# Patient Record
Sex: Female | Born: 2000 | Race: Black or African American | Hispanic: No | Marital: Single | Smoking: Never smoker
Health system: Southern US, Community
[De-identification: ages and names within clinical notes are randomized; demographics above are authoritative.]

## PROBLEM LIST (undated history)

## (undated) DIAGNOSIS — F419 Anxiety disorder, unspecified: Secondary | ICD-10-CM

---
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
## 2019-04-27 ENCOUNTER — Encounter: Payer: Self-pay | Admitting: Emergency Medicine

## 2019-04-27 ENCOUNTER — Other Ambulatory Visit: Payer: Self-pay

## 2019-04-27 ENCOUNTER — Ambulatory Visit
Admission: EM | Admit: 2019-04-27 | Discharge: 2019-04-27 | Disposition: A | Discharge status: Home / Self Care | Payer: Medicaid Other | Attending: Emergency Medicine | Admitting: Emergency Medicine

## 2019-04-27 DIAGNOSIS — S161XXA Strain of muscle, fascia and tendon at neck level, initial encounter: Secondary | ICD-10-CM

## 2019-04-27 MED ORDER — MELOXICAM 15 MG PO TABS: 15.0000 mg | ORAL_TABLET | Freq: Every day | ORAL | 0 refills | Status: AC

## 2019-04-27 MED ORDER — METAXALONE 800 MG PO TABS: 800.0000 mg | ORAL_TABLET | Freq: Three times a day (TID) | ORAL | 0 refills | Status: AC

## 2019-04-27 NOTE — Discharge Instructions (Signed)
Avoid symptoms as much as possible.Use  Caution while taking muscle relaxers.  Do not perform activities requiring concentration or judgment and do not drive.  Apply ice for 20 minutes 3 to 4 times daily as necessary for discomfort.  Consider using Biofreeze or similar gel for muscle pain 3 times daily.  If you are not improving recommend following up with your primary care physician
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
## 2020-10-15 ENCOUNTER — Ambulatory Visit: Payer: Medicaid Other

## 2020-11-06 ENCOUNTER — Ambulatory Visit: Payer: Medicaid Other

## 2020-11-29 ENCOUNTER — Emergency Department: Payer: 59

## 2020-11-29 ENCOUNTER — Emergency Department
Admission: EM | Admit: 2020-11-29 | Discharge: 2020-11-29 | Disposition: A | Discharge status: Home / Self Care | Payer: 59 | Attending: Emergency Medicine | Admitting: Emergency Medicine

## 2020-11-29 ENCOUNTER — Other Ambulatory Visit: Payer: Self-pay

## 2020-11-29 DIAGNOSIS — R1031 Right lower quadrant pain: Secondary | ICD-10-CM | POA: Insufficient documentation

## 2020-11-29 DIAGNOSIS — N16 Renal tubulo-interstitial disorders in diseases classified elsewhere: Secondary | ICD-10-CM | POA: Insufficient documentation

## 2020-11-29 DIAGNOSIS — N12 Tubulo-interstitial nephritis, not specified as acute or chronic: Secondary | ICD-10-CM

## 2020-11-29 LAB — URINALYSIS, COMPLETE (UACMP) WITH MICROSCOPIC
Bilirubin Urine: NEGATIVE
Glucose, UA: NEGATIVE mg/dL
Ketones, ur: NEGATIVE mg/dL
Nitrite: NEGATIVE
RBC / HPF: >50 RBC/hpf (ref 0–5)
Specific Gravity, Urine: 1.02 (ref 1.005–1.030)
WBC, UA: >50 WBC/hpf (ref 0–5)
pH: 7 (ref 5.0–8.0)

## 2020-11-29 LAB — POC URINE PREG, ED: Preg Test, Ur: NEGATIVE

## 2020-11-29 LAB — LIPASE, BLOOD: Lipase: 26 U/L (ref 11–51)

## 2020-11-29 LAB — CBC
HCT: 38.2 % (ref 36.0–46.0)
Hemoglobin: 12.1 g/dL (ref 12.0–15.0)
MCH: 24.5 pg — ABNORMAL LOW (ref 26.0–34.0)
MCHC: 31.7 g/dL (ref 30.0–36.0)
MCV: 77.3 fL — ABNORMAL LOW (ref 80.0–100.0)
Platelets: 327 10*3/uL (ref 150–400)
RBC: 4.94 MIL/uL (ref 3.87–5.11)
RDW: 17.2 % — ABNORMAL HIGH (ref 11.5–15.5)
WBC: 9.3 10*3/uL (ref 4.0–10.5)
nRBC: 0 % (ref 0.0–0.2)

## 2020-11-29 LAB — COMPREHENSIVE METABOLIC PANEL
ALT: 15 U/L (ref 0–44)
AST: 25 U/L (ref 15–41)
Albumin: 4.1 g/dL (ref 3.5–5.0)
Alkaline Phosphatase: 75 U/L (ref 38–126)
Anion gap: 6 (ref 5–15)
BUN: 12 mg/dL (ref 6–20)
CO2: 27 mmol/L (ref 22–32)
Calcium: 9.2 mg/dL (ref 8.9–10.3)
Chloride: 104 mmol/L (ref 98–111)
Creatinine, Ser: 0.76 mg/dL (ref 0.44–1.00)
GFR, Estimated: >60 mL/min (ref >60)
Glucose, Bld: 93 mg/dL (ref 70–99)
Potassium: 3.9 mmol/L (ref 3.5–5.1)
Sodium: 137 mmol/L (ref 135–145)
Total Bilirubin: 0.7 mg/dL (ref 0.3–1.2)
Total Protein: 7.6 g/dL (ref 6.5–8.1)

## 2020-11-29 MED ORDER — CEPHALEXIN 500 MG PO CAPS: 500.0000 mg | ORAL_CAPSULE | Freq: Four times a day (QID) | ORAL | 0 refills | Status: AC

## 2020-11-29 MED ORDER — CEPHALEXIN 500 MG PO CAPS
500.0000 mg | ORAL_CAPSULE | Freq: Once | ORAL | Status: AC
  Administered 2020-11-29: 500 mg via ORAL
  Filled 2020-11-29: qty 1

## 2020-11-29 MED ORDER — KETOROLAC TROMETHAMINE 30 MG/ML IJ SOLN
15.0000 mg | Freq: Once | INTRAMUSCULAR | Status: AC
  Administered 2020-11-29: 15 mg via INTRAVENOUS
  Filled 2020-11-29: qty 1

## 2020-11-29 NOTE — ED Provider Notes (Signed)
[facility]  ____________________________________________   Event Date/Time   First MD Initiated Contact with Patient 11/29/20 1240     (approximate)  I have reviewed the triage vital signs and the nursing notes.   HISTORY  Chief Complaint Abdominal Pain    HPI Felicia Duncan is a 20 y.o. female with no significant past medical history who presents with right-sided flank and abdominal pain.  Symptoms started several days ago.  She endorses pain in the right side of her back that radiates around to the right lower abdomen.  Has no nausea or vomiting.  Denies any fevers or chills.  Has had some constipation but no diarrhea.  She has also noticed blood in her urine and dysuria and urgency.  Has had prior UTIs that this feels worse than her normal UTI.  No prior history of kidney stones.  She is tolerating p.o.         History reviewed. No pertinent past medical history.  There are no problems to display for this patient.   History reviewed. No pertinent surgical history.  Prior to Admission medications   Medication Sig Start Date End Date Taking? Authorizing Provider  cephALEXin (KEFLEX) 500 MG capsule Take 1 capsule (500 mg total) by mouth 4 (four) times daily for 10 days. 11/29/20 12/09/20 Yes Georga Hacking, MD  cetirizine (ZYRTEC) 10 MG chewable tablet Chew 1 tablet (10 mg total) by mouth daily. 11/29/18   Wurst, [place], PA-C  fluticasone (FLONASE) 50 MCG/ACT nasal spray Place 2 sprays into both nostrils daily. 11/29/18   Wurst, [place], PA-C  meloxicam (MOBIC) 15 MG tablet Take 1 tablet (15 mg total) by mouth daily. 04/27/19   Lutricia Feil, PA-C  metaxalone (SKELAXIN) 800 MG tablet Take 1 tablet (800 mg total) by mouth 3 (three) times daily. 04/27/19   Lutricia Feil, PA-C  TRI-LO-MARZIA 0.18/0.215/0.25 MG-25 MCG tab Take 1 tablet by mouth daily. 10/17/17   [provider]    Allergies Patient has no known allergies.  Family  History  Problem Relation Age of Onset   Hypertension Maternal Grandmother    Lupus Paternal Grandmother    Hypertension Mother    Healthy Father     Social History Social History   Tobacco Use   Smoking status: Never   Smokeless tobacco: Never  Vaping Use   Vaping Use: Never used  Substance Use Topics   Alcohol use: Never   Drug use: Never    Review of Systems   Review of Systems  Constitutional:  Negative for chills and fever.  Respiratory:  Negative for shortness of breath.   Gastrointestinal:  Positive for abdominal pain and constipation. Negative for diarrhea, nausea and vomiting.  Genitourinary:  Positive for dyspareunia, flank pain and hematuria. Negative for menstrual problem, vaginal discharge and vaginal pain.  All other systems reviewed and are negative.  Physical Exam Updated Vital Signs BP 120/76   Pulse 74   Temp 98 F (36.7 C)   Resp 16   LMP 11/12/2020   SpO2 100%   Physical Exam Vitals and nursing note reviewed.  Constitutional:      General: She is not in acute distress.    Appearance: Normal appearance.  HENT:     Head: Normocephalic and atraumatic.  Eyes:     General: No scleral icterus.    Conjunctiva/sclera: Conjunctivae normal.  Pulmonary:     Effort: Pulmonary effort is normal. No respiratory distress.     Breath sounds:  No stridor.  Musculoskeletal:        General: No deformity or signs of injury.     Cervical back: Normal range of motion.  Skin:    General: Skin is dry.     Coloration: Skin is not jaundiced or pale.  Neurological:     General: No focal deficit present.     Mental Status: She is alert and oriented to person, place, and time. Mental status is at baseline.  Psychiatric:        Mood and Affect: Mood normal.        Behavior: Behavior normal.     LABS (all labs ordered are listed, but only abnormal results are displayed)  Labs Reviewed  CBC - Abnormal; Notable for the following components:      Result Value    MCV 77.3 (*)    MCH 24.5 (*)    RDW 17.2 (*)    All other components within normal limits  URINALYSIS, COMPLETE (UACMP) WITH MICROSCOPIC - Abnormal; Notable for the following components:   APPearance HAZY (*)    Hgb urine dipstick MODERATE (*)    Protein, ur TRACE (*)    Leukocytes,Ua MODERATE (*)    Non Squamous Epithelial PRESENT (*)    Bacteria, UA RARE (*)    All other components within normal limits  URINE CULTURE  LIPASE, BLOOD  COMPREHENSIVE METABOLIC PANEL  POC URINE PREG, ED   ____________________________________________  EKG  N/a ____________________________________________  RADIOLOGY Ky Barban, personally viewed and evaluated these images (plain radiographs) as part of my medical decision making, as well as reviewing the written report by the radiologist.  ED MD interpretation: I reviewed the CT renal study which does not show any hydronephrosis or stone    ____________________________________________   PROCEDURES  Procedure(s) performed (including Critical Care):  Procedures   ____________________________________________   INITIAL IMPRESSION / ASSESSMENT AND PLAN / ED COURSE     20 year old female presenting with several days of right-sided flank and abdominal pain and urinary symptoms.  Vital signs within normal limits.  She is very well-appearing.  She has mild right-sided CVA tenderness as well as some mild tenderness in the mid right lower quadrant without guarding.  I am suspicious for kidney stone.  Will check UA and blood work.  Likely will need renal study.  Patient's labs are reassuring.  Her UA shows leuks and RBCs.  CT renal study obtained which does not show any stone.  She does have a small ovarian cyst.  My suspicion for torsion is exceedingly low, cyst is less than 5 cm and she appears very comfortable at this time.  Will treat for pyelonephritis given her UTI and flank pain.  Will write for 10-day course of Keflex.  We  discussed return precautions.  Clinical Course as of 11/29/20 1535  Sun Nov 29, 2020  1320 Preg Test, Ur: NEGATIVE [KM]    Clinical Course User Index [KM] Georga Hacking, MD     ____________________________________________   FINAL CLINICAL IMPRESSION(S) / ED DIAGNOSES  Final diagnoses:  Pyelonephritis     ED Discharge Orders          Ordered    cephALEXin (KEFLEX) 500 MG capsule  4 times daily        11/29/20 1530             Note:  This document was prepared using Dragon voice recognition software and may include unintentional dictation errors.    Augusto Gamble  Okey Dupre, MD 11/29/20 1535

## 2020-11-29 NOTE — ED Triage Notes (Signed)
Pt come with c/o right lower abdominal pain for few days. Pt denies any N.V.d  Pt states some constipation

## 2020-11-29 NOTE — Discharge Instructions (Signed)
Please take the antibiotic twice daily for the next 10 days. 

## 2020-12-02 LAB — URINE CULTURE: Culture: 40000 — AB

## 2021-08-20 ENCOUNTER — Encounter: Payer: Self-pay | Admitting: Emergency Medicine

## 2021-08-20 ENCOUNTER — Other Ambulatory Visit: Payer: Self-pay

## 2021-08-20 ENCOUNTER — Emergency Department: Payer: 59

## 2021-08-20 ENCOUNTER — Emergency Department
Admission: EM | Admit: 2021-08-20 | Discharge: 2021-08-20 | Disposition: A | Discharge status: Home / Self Care | Payer: 59 | Attending: Emergency Medicine | Admitting: Emergency Medicine

## 2021-08-20 DIAGNOSIS — D72829 Elevated white blood cell count, unspecified: Secondary | ICD-10-CM | POA: Insufficient documentation

## 2021-08-20 DIAGNOSIS — Z3A01 Less than 8 weeks gestation of pregnancy: Secondary | ICD-10-CM | POA: Diagnosis not present

## 2021-08-20 DIAGNOSIS — O4690 Antepartum hemorrhage, unspecified, unspecified trimester: Secondary | ICD-10-CM

## 2021-08-20 DIAGNOSIS — O209 Hemorrhage in early pregnancy, unspecified: Secondary | ICD-10-CM | POA: Insufficient documentation

## 2021-08-20 HISTORY — DX: Anxiety disorder, unspecified: F41.9

## 2021-08-20 LAB — COMPREHENSIVE METABOLIC PANEL
ALT: 14 U/L (ref 0–44)
AST: 21 U/L (ref 15–41)
Albumin: 3.7 g/dL (ref 3.5–5.0)
Alkaline Phosphatase: 56 U/L (ref 38–126)
Anion gap: 7 (ref 5–15)
BUN: 10 mg/dL (ref 6–20)
CO2: 24 mmol/L (ref 22–32)
Calcium: 9.3 mg/dL (ref 8.9–10.3)
Chloride: 107 mmol/L (ref 98–111)
Creatinine, Ser: 0.59 mg/dL (ref 0.44–1.00)
GFR, Estimated: >60 mL/min (ref >60)
Glucose, Bld: 103 mg/dL — ABNORMAL HIGH (ref 70–99)
Potassium: 3.5 mmol/L (ref 3.5–5.1)
Sodium: 138 mmol/L (ref 135–145)
Total Bilirubin: 0.4 mg/dL (ref 0.3–1.2)
Total Protein: 7.4 g/dL (ref 6.5–8.1)

## 2021-08-20 LAB — CBC WITH DIFFERENTIAL/PLATELET
Abs Immature Granulocytes: 0.04 10*3/uL (ref 0.00–0.07)
Basophils Absolute: 0 10*3/uL (ref 0.0–0.1)
Basophils Relative: 0 %
Eosinophils Absolute: 0 10*3/uL (ref 0.0–0.5)
Eosinophils Relative: 0 %
HCT: 37.9 % (ref 36.0–46.0)
Hemoglobin: 11.8 g/dL — ABNORMAL LOW (ref 12.0–15.0)
Immature Granulocytes: 1 %
Lymphocytes Relative: 19 %
Lymphs Abs: 1.6 10*3/uL (ref 0.7–4.0)
MCH: 25.2 pg — ABNORMAL LOW (ref 26.0–34.0)
MCHC: 31.1 g/dL (ref 30.0–36.0)
MCV: 81 fL (ref 80.0–100.0)
Monocytes Absolute: 0.5 10*3/uL (ref 0.1–1.0)
Monocytes Relative: 6 %
Neutro Abs: 6.1 10*3/uL (ref 1.7–7.7)
Neutrophils Relative %: 74 %
Platelets: 269 10*3/uL (ref 150–400)
RBC: 4.68 MIL/uL (ref 3.87–5.11)
RDW: 15.7 % — ABNORMAL HIGH (ref 11.5–15.5)
WBC: 8.3 10*3/uL (ref 4.0–10.5)
nRBC: 0 % (ref 0.0–0.2)

## 2021-08-20 LAB — URINALYSIS, ROUTINE W REFLEX MICROSCOPIC
Bilirubin Urine: NEGATIVE
Glucose, UA: NEGATIVE mg/dL
Hgb urine dipstick: NEGATIVE
Ketones, ur: NEGATIVE mg/dL
Nitrite: NEGATIVE
Protein, ur: NEGATIVE mg/dL
Specific Gravity, Urine: 1.012 (ref 1.005–1.030)
pH: 6 (ref 5.0–8.0)

## 2021-08-20 LAB — HCG, QUANTITATIVE, PREGNANCY: hCG, Beta Chain, Quant, S: 73815 m[IU]/mL — ABNORMAL HIGH (ref <5)

## 2021-08-20 LAB — ABO/RH: ABO/RH(D): A POS

## 2021-08-20 LAB — POC URINE PREG, ED: Preg Test, Ur: POSITIVE — AB

## 2021-08-20 MED ORDER — CEPHALEXIN 500 MG PO CAPS: 500.0000 mg | ORAL_CAPSULE | Freq: Three times a day (TID) | ORAL | 0 refills | Status: AC

## 2021-08-20 NOTE — Discharge Instructions (Addendum)
-  Follow-up with the OB/GYN provider as discussed.  -Take all antibiotics as prescribed  -Return to the emergency department anytime if you begin to experience any new or worsening symptoms.

## 2021-08-20 NOTE — ED Notes (Addendum)
First Nurse Note:  Pt to ED via POV stating that she is 4-[redacted] weeks pregnant. Pt states that she is having some mild spotting yesterday afternoon. Pt states that there was only a little blood on the tissue when she wiped, when she wiped a second time there was no blood. Her PCP and OBGYN told her to come to the ED.   LMP 07/04/2021

## 2021-08-20 NOTE — ED Provider Notes (Addendum)
[facility] Provider Note    Event Date/Time   First MD Initiated Contact with Patient 08/20/21 1506     (approximate)   History   Chief Complaint Vaginal Bleeding   HPI Felicia Duncan is a 21 y.o. female, history of anxiety, presents to the emergency department for evaluation of vaginal bleeding.  She states that she is estimated approximately 4 to [redacted] weeks pregnant with her first pregnancy she has not had her first appointment with OB/GYN yet.  She states that over the past 2 days, she has had a few episodes of light vaginal bleeding that she has predominantly noticed when she wipes after urinating.  She has had some lower abdominal cramping as well, though reportedly nothing severe.  Endorses some mild nausea as well.  She contacted her primary care provider, who referred her to the emergency department.  Denies any recent trauma, illnesses, or injuries.  Denies fever/chills, dysuria, chest pain, shortness of breath, abdominal pain, flank pain, rash/lesions, or dizziness/lightheadedness.   History Limitations: No limitations        Physical Exam  Triage Vital Signs: ED Triage Vitals  Enc Vitals Group     BP 08/20/21 1329 120/63     Pulse Rate 08/20/21 1329 (!) 102     Resp 08/20/21 1329 16     Temp 08/20/21 1329 98.1 F (36.7 C)     Temp Source 08/20/21 1329 Oral     SpO2 08/20/21 1329 98 %     Weight 08/20/21 1325 193 lb (87.5 kg)     Height 08/20/21 1325 5\' 2"  (1.575 m)     Head Circumference --      Peak Flow --      Pain Score 08/20/21 1325 0     Pain Loc --      Pain Edu? --      Excl. in [place]? --     Most recent vital signs: Vitals:   08/20/21 1329  BP: 120/63  Pulse: (!) 102  Resp: 16  Temp: 98.1 F (36.7 C)  SpO2: 98%    General: Awake, NAD.  Skin: Warm, dry. No rashes or lesions.  Eyes: PERRL. Conjunctivae normal.  CV: Good peripheral perfusion.  Resp: Normal effort.  Abd: Soft, non-tender. No distention.  Neuro: At  baseline. No gross neurological deficits.   Focused Exam: N/A  Physical Exam    ED Results / Procedures / Treatments  Labs (all labs ordered are listed, but only abnormal results are displayed) Labs Reviewed  COMPREHENSIVE METABOLIC PANEL - Abnormal; Notable for the following components:      Result Value   Glucose, Bld 103 (*)    All other components within normal limits  CBC WITH DIFFERENTIAL/PLATELET - Abnormal; Notable for the following components:   Hemoglobin 11.8 (*)    MCH 25.2 (*)    RDW 15.7 (*)    All other components within normal limits  HCG, QUANTITATIVE, PREGNANCY - Abnormal; Notable for the following components:   hCG, Beta Chain, Quant, S [id number] (*)    All other components within normal limits  URINALYSIS, ROUTINE W REFLEX MICROSCOPIC - Abnormal; Notable for the following components:   Color, Urine YELLOW (*)    APPearance HAZY (*)    Leukocytes,Ua TRACE (*)    Bacteria, UA RARE (*)    All other components within normal limits  POC URINE PREG, ED - Abnormal; Notable for the following components:   Preg Test, Ur Positive (*)  All other components within normal limits  ABO/RH     EKG N/A.   RADIOLOGY  ED Provider Interpretation: I personally viewed and interpreted this ultrasound.  Single live uterine pregnancy with fetal heart tones present.  US OB LESS THAN 14 WEEKS WITH OB TRANSVAGINAL  Result Date: 08/20/2021 CLINICAL DATA:  Vaginal bleeding.  Positive pregnancy test EXAM: OBSTETRIC <14 WK [place] AND TRANSVAGINAL OB US TECHNIQUE: Both transabdominal and transvaginal ultrasound examinations were performed for complete evaluation of the gestation as well as the maternal uterus, adnexal regions, and pelvic cul-de-sac. Transvaginal technique was performed to assess early pregnancy. COMPARISON:  None Available. FINDINGS: Intrauterine gestational sac: Single Yolk sac:  Visualized. Embryo:  Visualized. Cardiac Activity: Visualized. Heart Rate: 129 bpm CRL:  5.2  mm   6 w   1 d                  [place] EDC: 04/11/2022 Subchorionic hemorrhage:  None visualized. Maternal uterus/adnexae: Uterus and bilateral ovaries within normal limits. IMPRESSION: 1. Single live intrauterine gestation measuring 6 weeks 1 day by crown-rump length. 2. Active heart tones at 129 BPM. Electronically Signed   By: Duanne Guess D.O.   On: 08/20/2021 17:13    PROCEDURES:  Critical Care performed: N/A.  Procedures    MEDICATIONS ORDERED IN ED: Medications - No data to display   IMPRESSION / MDM / ASSESSMENT AND PLAN / ED COURSE  I reviewed the triage vital signs and the nursing notes.                              Differential diagnosis includes, but is not limited to, spontaneous abortion, implantation bleeding, cystitis, cervical polyps, fibroids, ectopic pregnancy, subchorionic hemorrhage.  ED Course Patient appears well, vitals within normal limits.  NAD  CBC shows no leukocytosis or clinically significant anemia  CMP shows no evidence of transaminitis, AKI, or electrolyte abnormalities.  Beta-hCG [id number], consistent with ultrasound findings.  Urinalysis shows trace leukocytes and rare bacteria.  She is currently asymptomatic at this time.  Assessment/Plan Patient presents with 2 episodes of mild vaginal bleeding while wiping.  She clinically appears well.  Lab work-up has been reassuring, though does show some trace leukocytes and bacteria on urinalysis we will provide her with a short course of cephalexin.  Ultrasound reassuring for single live intrauterine gestation measuring 6 weeks and 1 day, consistent with her beta-hCG.  No evidence of any serious pathology at this point.  Advised her to follow-up with her OB/GYN in a couple weeks, as originally scheduled.  We will plan to discharge.  Patient's presentation is most consistent with acute complicated illness / injury requiring diagnostic workup.   Considered admission for this patient, but given her stable  presentation and reassuring work-up, she is unlikely to benefit from admission  Provided the patient with anticipatory guidance, return precautions, and educational material. Encouraged the patient to return to the emergency department at any time if they begin to experience any new or worsening symptoms. Patient expressed understanding and agreed with the plan.       FINAL CLINICAL IMPRESSION(S) / ED DIAGNOSES   Final diagnoses:  Vaginal bleeding in pregnancy     Rx / DC Orders   ED Discharge Orders          Ordered    cephALEXin (KEFLEX) 500 MG capsule  3 times daily        08/20/21 1728  Note:  This document was prepared using Dragon voice recognition software and may include unintentional dictation errors.   Varney Daily, PA 08/20/21 1732    Varney Daily, [place] 08/20/21 1733    Delton Prairie, MD 08/20/21 [phone]

## 2022-01-22 ENCOUNTER — Emergency Department
Admission: EM | Admit: 2022-01-22 | Discharge: 2022-01-22 | Disposition: A | Discharge status: Home / Self Care | Payer: 59 | Attending: Emergency Medicine | Admitting: Emergency Medicine

## 2022-01-22 ENCOUNTER — Other Ambulatory Visit: Payer: Self-pay

## 2022-01-22 DIAGNOSIS — Z3A29 29 weeks gestation of pregnancy: Secondary | ICD-10-CM | POA: Insufficient documentation

## 2022-01-22 DIAGNOSIS — F410 Panic disorder [episodic paroxysmal anxiety] without agoraphobia: Secondary | ICD-10-CM | POA: Insufficient documentation

## 2022-01-22 DIAGNOSIS — O26893 Other specified pregnancy related conditions, third trimester: Secondary | ICD-10-CM | POA: Diagnosis present
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
## 2022-07-03 IMAGING — CT CT RENAL STONE PROTOCOL
2 of 4 series · 16 of 46 positions shown, 18 images · non-contrast
Comparison: January 01, 2018.

CLINICAL DATA: Right lower abdomen pain.

EXAM:
CT ABDOMEN AND PELVIS WITHOUT CONTRAST
TECHNIQUE: Multidetector CT imaging of the abdomen and pelvis was performed
following the standard protocol without IV contrast.

[Series 2: stone full standard · axial · 0.71mm/px · z∈[-403,+17]mm · 13 of 93 slices shown, 15 images]
[im 5/93  soft-tissue]
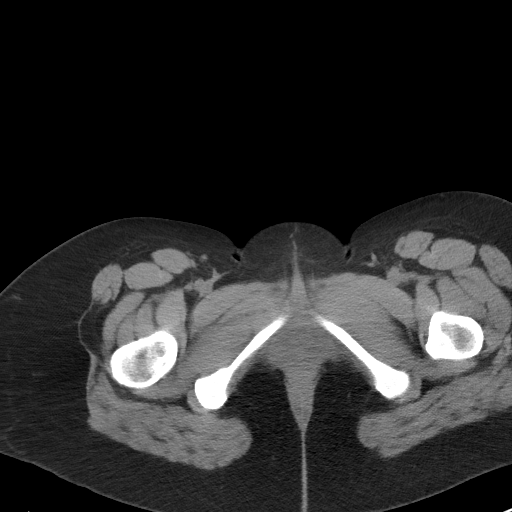
[im 5/93  bone]
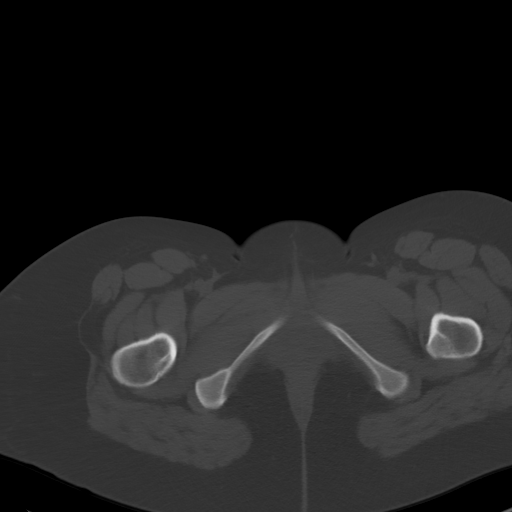
[im 13/93  soft-tissue]
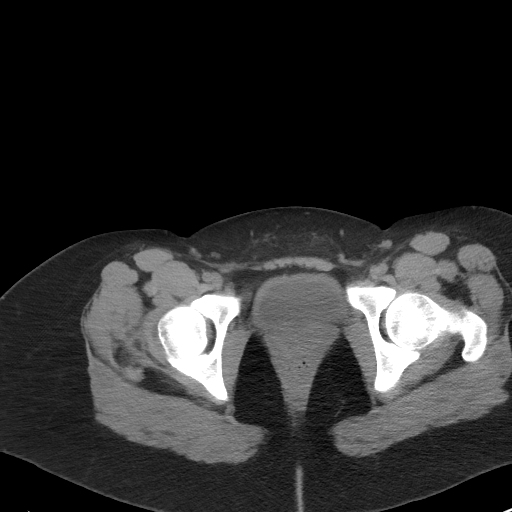
[im 21/93  soft-tissue]
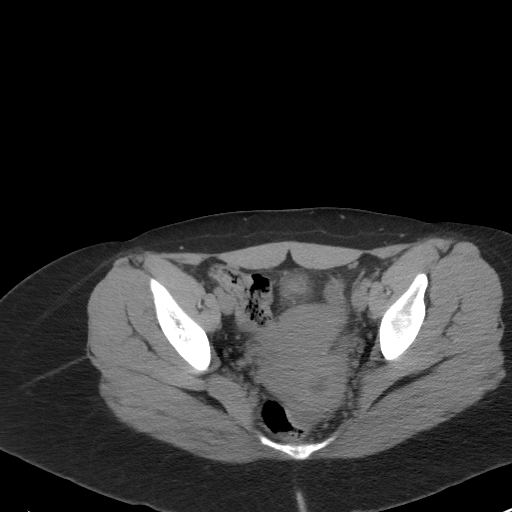
[im 25/93  soft-tissue]
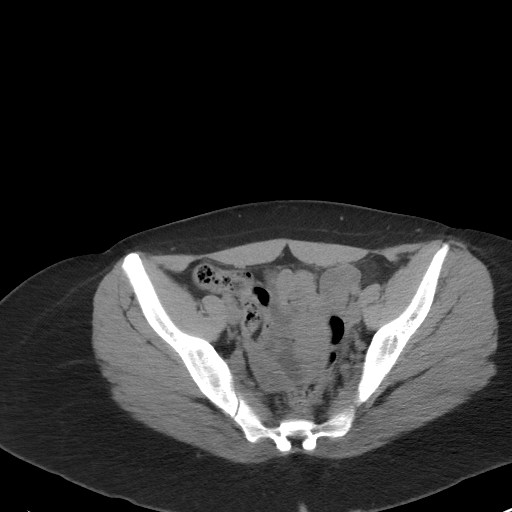
[im 33/93  soft-tissue]
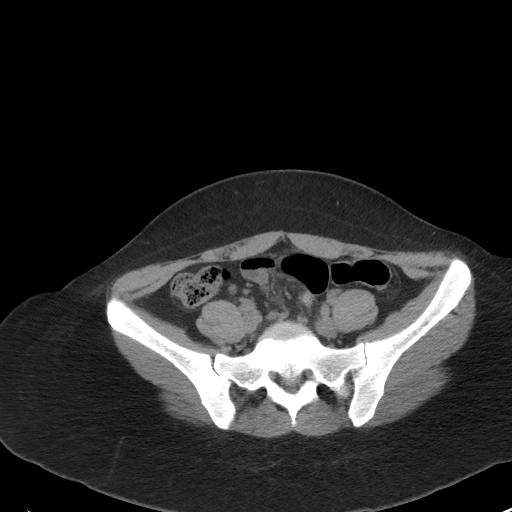
[im 41/93  soft-tissue]
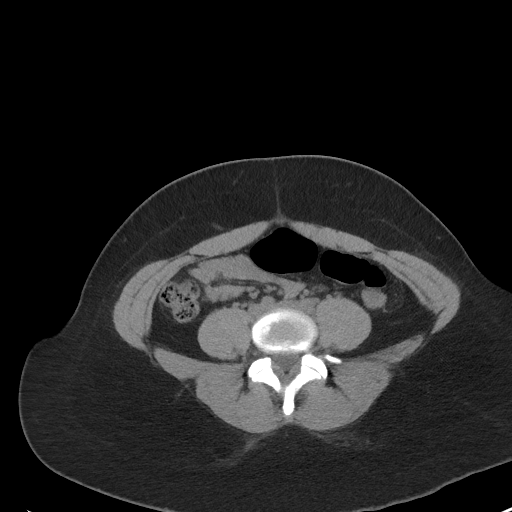
[im 49/93  soft-tissue]
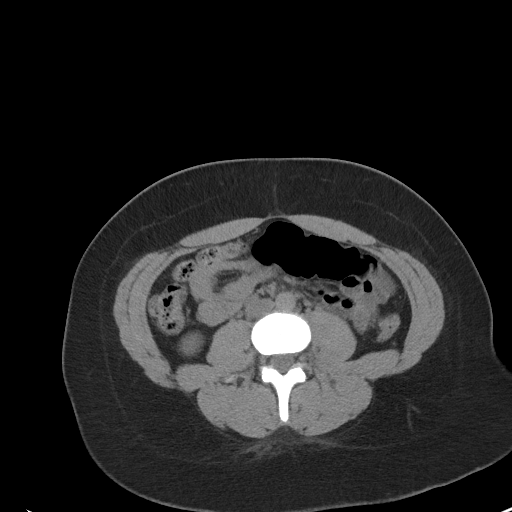
[im 53/93  soft-tissue]
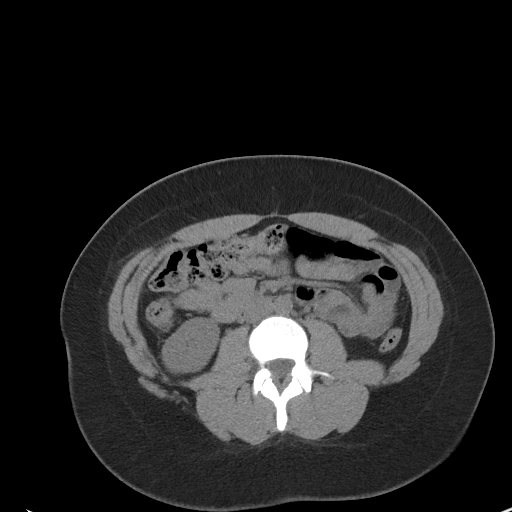
[im 61/93  soft-tissue]
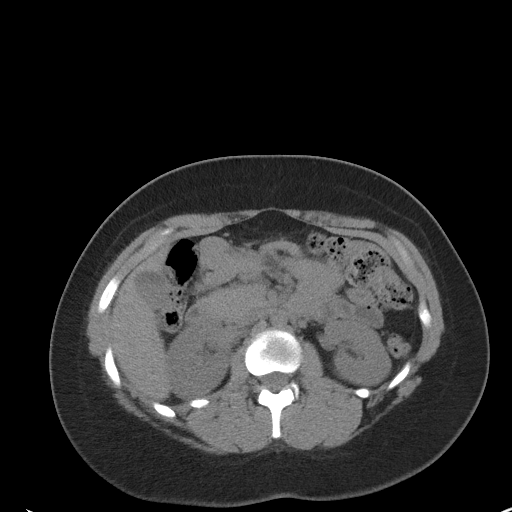
[im 61/93  bone]
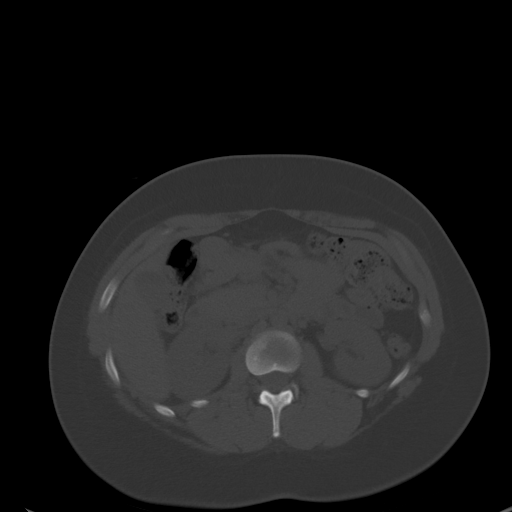
[im 69/93  soft-tissue]
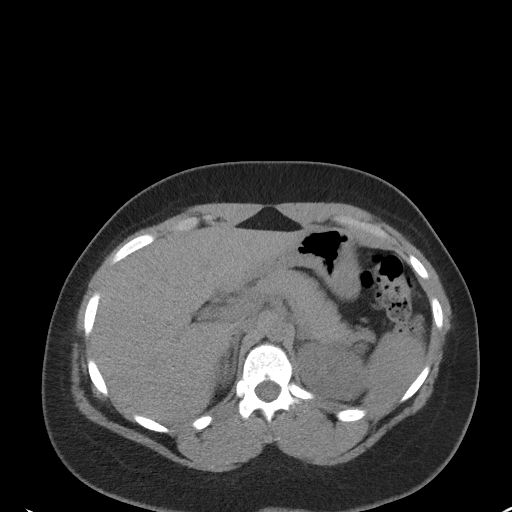
[im 73/93  soft-tissue]
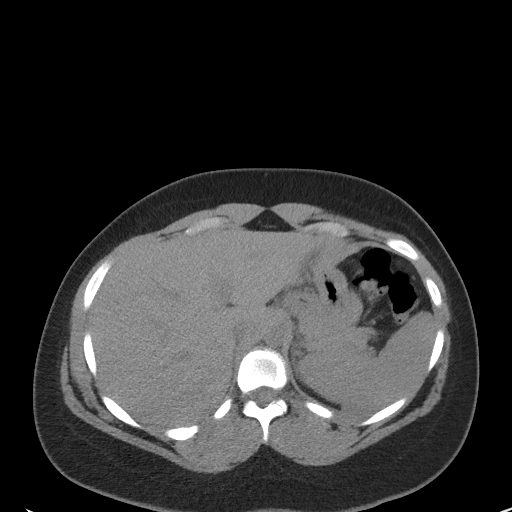
[im 81/93  soft-tissue]
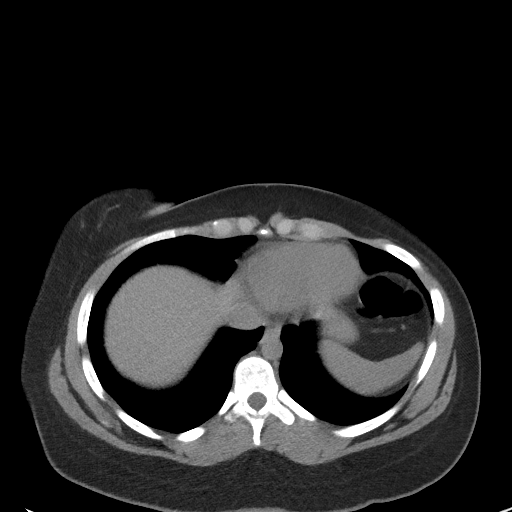
[im 89/93  soft-tissue]
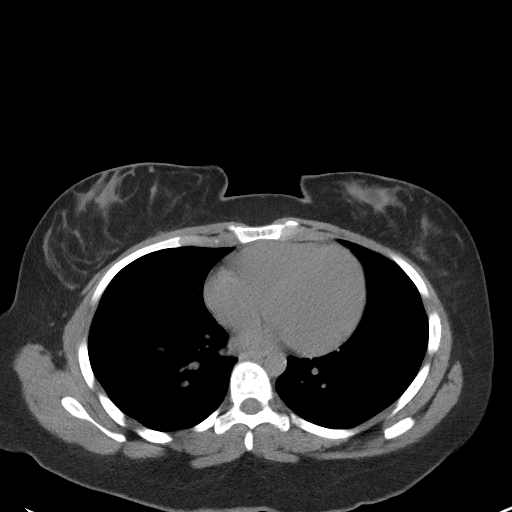

[Series 5: coronal · coronal · 0.62mm/px · 3 of 150 slices shown]
[im 50/150  soft-tissue]
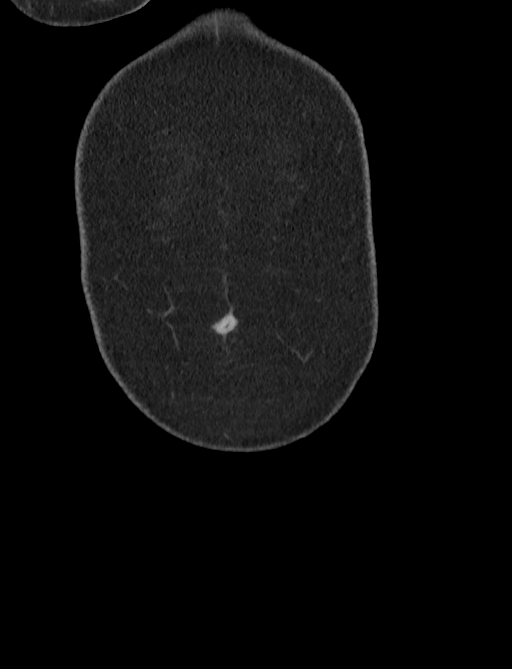
[im 67/150  soft-tissue]
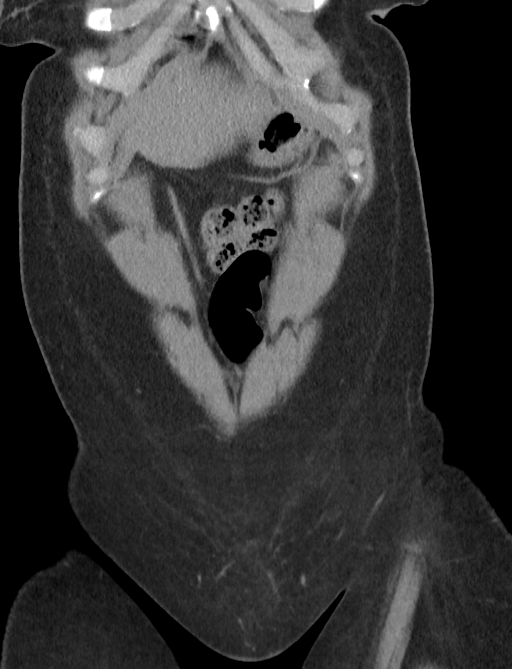
[im 83/150  soft-tissue]
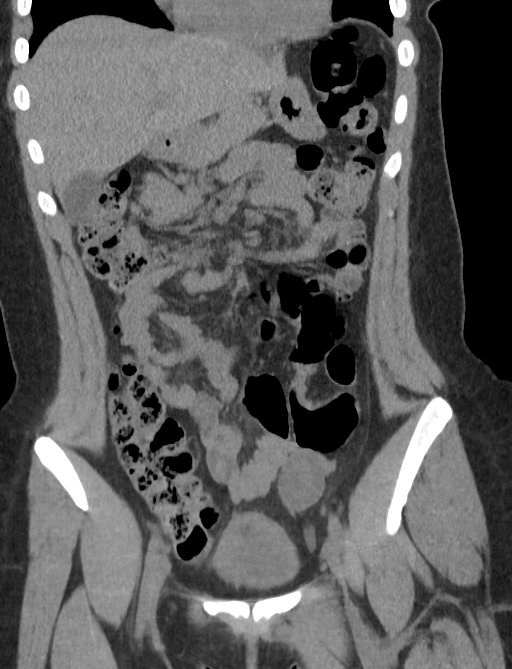

[16 of 46 positions shown; findings below may reference images not displayed]

FINDINGS: Lower chest: No acute abnormality.

Hepatobiliary: No focal liver abnormality is seen. No gallstones,
gallbladder wall thickening, or biliary dilatation.

Pancreas: Unremarkable. No pancreatic ductal dilatation or
surrounding inflammatory changes.

Spleen: Normal in size without focal abnormality.

Adrenals/Urinary Tract: Adrenal glands are unremarkable. Kidneys are
normal, without renal calculi, focal lesion, or hydronephrosis.
Bladder is unremarkable.

Stomach/Bowel: Stomach is within normal limits. Appendix appears
normal. No evidence of bowel wall thickening, distention, or
inflammatory changes.

Vascular/Lymphatic: No significant vascular findings are present. No
enlarged abdominal or pelvic lymph nodes.

Reproductive: Uterus and bilateral adnexa are unremarkable. 2.4 cm
low densities identified in the right adnexa likely right ovarian
cyst.

Other: None.

Musculoskeletal: No acute abnormality.
IMPRESSION: 1. No acute abnormality identified in the abdomen and pelvis. The
appendix is normal.
2. 2.4 cm low-density identified in the right adnexa likely right
ovarian cyst.

## 2023-02-27 ENCOUNTER — Other Ambulatory Visit: Payer: Self-pay

## 2023-02-27 ENCOUNTER — Emergency Department: Payer: 59

## 2023-02-27 ENCOUNTER — Emergency Department
Admission: EM | Admit: 2023-02-27 | Discharge: 2023-02-27 | Disposition: A | Discharge status: Home / Self Care | Payer: 59 | Attending: Emergency Medicine | Admitting: Emergency Medicine

## 2023-02-27 DIAGNOSIS — M545 Low back pain, unspecified: Secondary | ICD-10-CM | POA: Insufficient documentation

## 2023-02-27 DIAGNOSIS — S39012A Strain of muscle, fascia and tendon of lower back, initial encounter: Secondary | ICD-10-CM

## 2023-02-27 MED ORDER — KETOROLAC TROMETHAMINE 60 MG/2ML IM SOLN
60.0000 mg | Freq: Once | INTRAMUSCULAR | Status: DC
  Filled 2023-02-27: qty 2

## 2023-02-27 MED ORDER — TRAMADOL HCL 50 MG PO TABS: 50.0000 mg | ORAL_TABLET | Freq: Four times a day (QID) | ORAL | 0 refills | Status: AC | PRN

## 2023-02-27 NOTE — Discharge Instructions (Signed)
Please take medicine  as advised.  Go to your appointment with PT this Friday.  Call orthopedics to make an appointment.

## 2023-02-27 NOTE — ED Triage Notes (Signed)
Pt comes with lower back pain that radiates to hips and upper legs. Pt stats this started few weeks ago. Pt was placed on meds but nothing is helping. Pt was advised by PCP to come to ED to get seen.

## 2023-02-27 NOTE — ED Provider Notes (Signed)
[facility] Provider Note    Event Date/Time   First MD Initiated Contact with Patient 02/27/23 1410     (approximate)   History   Back Pain   HPI  Felicia Duncan is a 22 y.o. female presents today with  8  weeks of  lumbar pain that worsen today.   Patient states pain is dull and aching, and concentrated in the center of her back where her spine is. Pain is not unilateral in nature.  Pain is exacerbated when she lifts heavy objects from a low height. Patient refers radiation to her left hip, numbness and tingling.  No urinary or fecal incontinence.  Patient visited her PCP who gave to her steroids and naproxen without improving the pain.  Patient has PT this Friday.      Physical Exam   Triage Vital Signs: ED Triage Vitals  Encounter Vitals Group     BP 02/27/23 1319 (!) 138/111     Systolic BP Percentile --      Diastolic BP Percentile --      Pulse Rate 02/27/23 1319 76     Resp 02/27/23 1319 18     Temp 02/27/23 1319 98 F (36.7 C)     Temp src --      SpO2 02/27/23 1319 99 %     Weight 02/27/23 1320 190 lb (86.2 kg)     Height 02/27/23 1320 5\' 2"  (1.575 m)     Head Circumference --      Peak Flow --      Pain Score 02/27/23 1320 10     Pain Loc --      Pain Education --      Exclude from Growth Chart --     Most recent vital signs: Vitals:   02/27/23 1319  BP: (!) 138/111  Pulse: 76  Resp: 18  Temp: 98 F (36.7 C)  SpO2: 99%     General: Awake, no distress.  CV:  Good peripheral perfusion.  Resp:  Normal effort.   Abd:  No distention.  Other:Lumbar back:  No deformity or scoliosis.  ZOX:[id number]  flexion and extension, right and left side bending. Palpation: no tenderness to palpation on spine or paraspinal muscles. Strength: 5/5 strength bilaterally on hip flexion. .  Sensation: intact to light touch bilaterally.  Bilateral leg rise positive more painful with the left leg. No signs of saddle anesthesia..     ED Results /  Procedures / Treatments   Labs (all labs ordered are listed, but only abnormal results are displayed) Labs Reviewed - No data to display   EKG     RADIOLOGY I independently reviewed and interpreted imaging and agree with radiologists findings.  Reports no fractures or malalignments.  Stainable L5-S1 decreased space.     PROCEDURES:  Critical Care performed:   Procedures   MEDICATIONS ORDERED IN ED: Medications  ketorolac (TORADOL) injection 60 mg (has no administration in time range)     IMPRESSION / MDM / ASSESSMENT AND PLAN / ED COURSE  I reviewed the triage vital signs and the nursing notes.  Differential diagnosis includes, but is not limited to, muscle sprain, herniated or slipped disc and sciatica. Patient's presentation is most consistent with acute complicated illness / injury requiring diagnostic workup.  Patient's diagnosis is consistent with muscle sprain.  Lumbar x-ray reported no fracture or malalignment.  Patient will be discharged home with prescriptions for tramadol for pain.  She is not breast-feeding  at this moment. Patient is to follow up with orthopedics as needed or otherwise directed. Patient is given ED precautions to return to the ED for any worsening or new symptoms. Discussed plan of care with patient, answered all of patient's questions, Patient agreeable to plan of care. Advised patient to take medications according to the instructions on the label. Discussed possible side effects of new medications. Patient verbalized understanding. Clinical Course as of 02/27/23 1625  Mon Feb 27, 2023  1609 DG Lumbar Spine Complete [AE]    Clinical Course User Index [AE] Gladys Damme, PA-C     FINAL CLINICAL IMPRESSION(S) / ED DIAGNOSES   Final diagnoses:  None     Rx / DC Orders   ED Discharge Orders     None        Note:  This document was prepared using Dragon voice recognition software and may include unintentional dictation  errors.   Gladys Damme, PA-C 02/27/23 1930    Jene Every, MD 02/28/23 [phone]

## 2023-03-20 ENCOUNTER — Other Ambulatory Visit: Payer: Self-pay | Admitting: Student

## 2023-03-20 DIAGNOSIS — R200 Anesthesia of skin: Secondary | ICD-10-CM

## 2023-03-20 DIAGNOSIS — S39012A Strain of muscle, fascia and tendon of lower back, initial encounter: Secondary | ICD-10-CM

## 2023-03-24 IMAGING — US US OB < 14 WEEKS - US OB TV
1 series · 15 of 28 positions shown · non-contrast
Comparison: None Available.

CLINICAL DATA: Vaginal bleeding.  Positive pregnancy test

EXAM:
OBSTETRIC <14 WK US AND TRANSVAGINAL OB US
TECHNIQUE: Both transabdominal and transvaginal ultrasound examinations were
performed for complete evaluation of the gestation as well as the
maternal uterus, adnexal regions, and pelvic cul-de-sac.
Transvaginal technique was performed to assess early pregnancy.

[Series 1: us ob comp less 14 wks · 94 acquisitions, 15 frames shown]
[im 1/94]
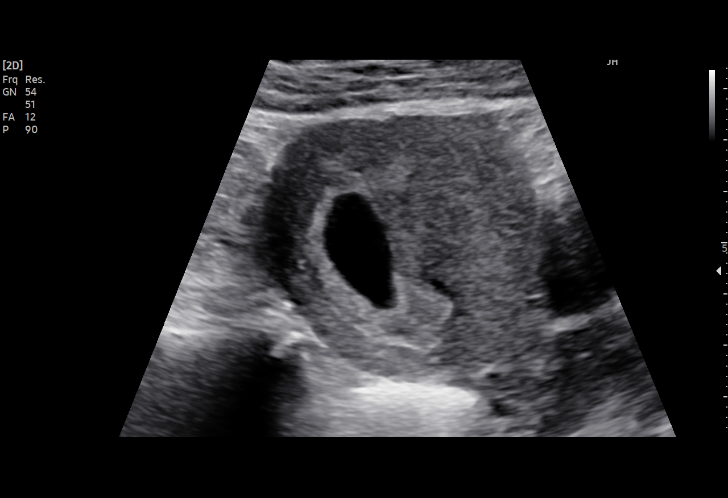
[im 7/94]
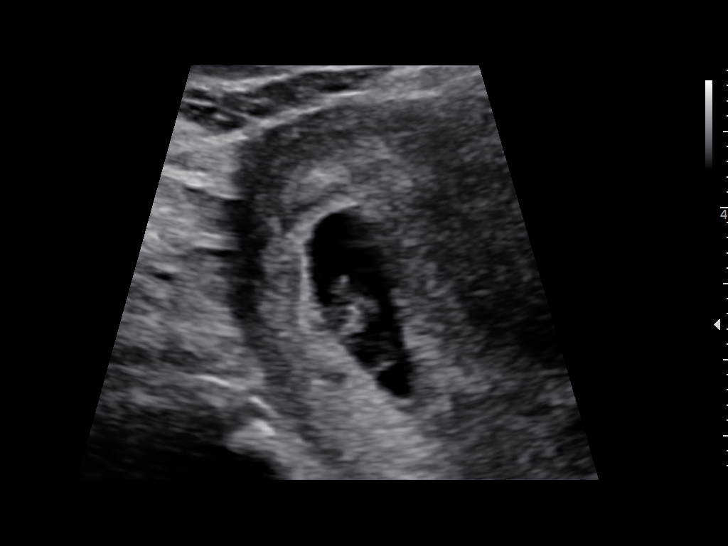
[im 14/94]
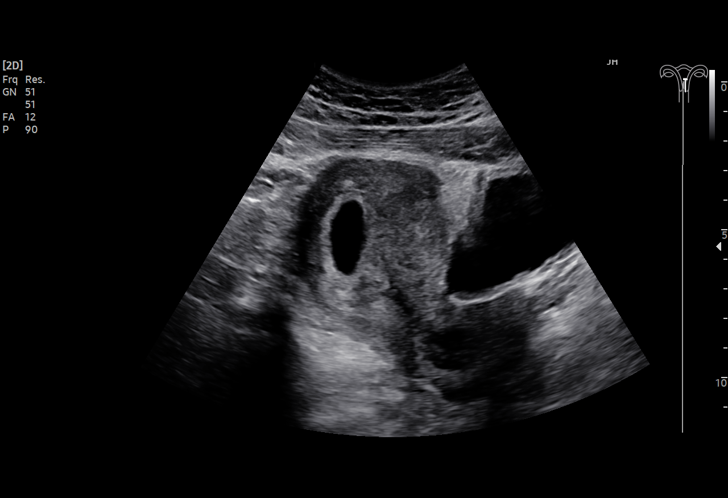
[im 21/94]
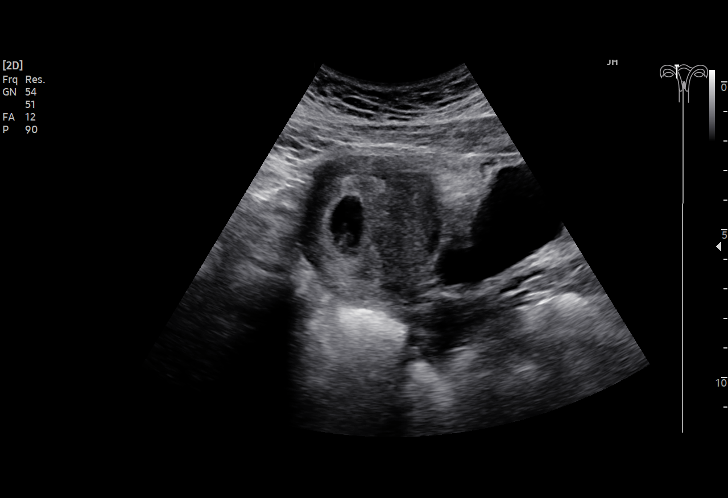
[im 28/94]
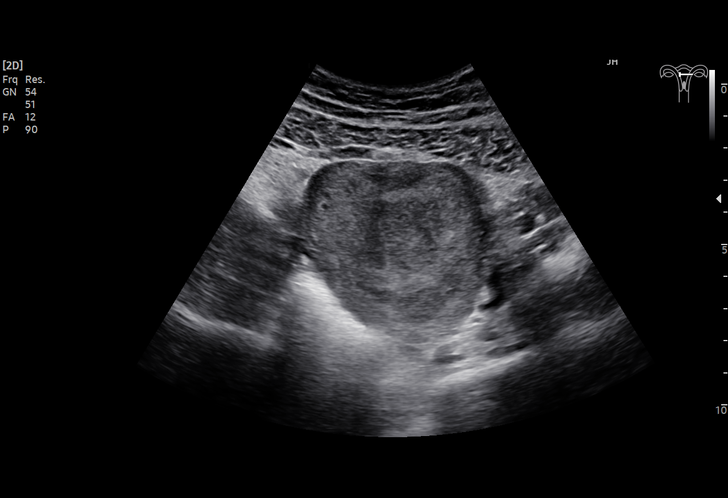
[im 35/94]
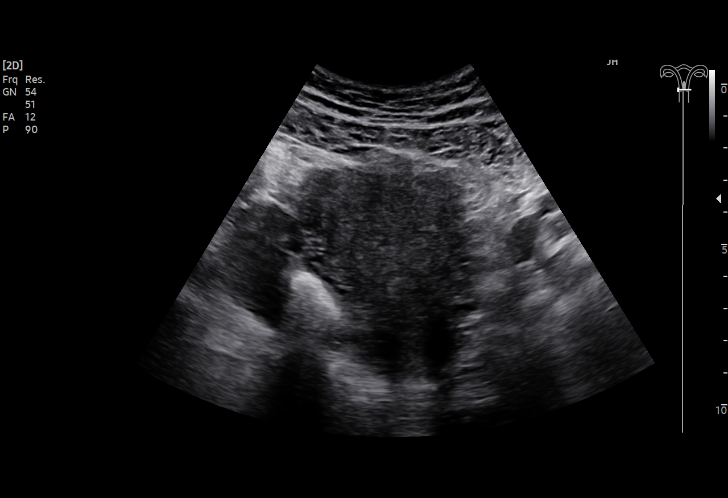
[im 42/94]
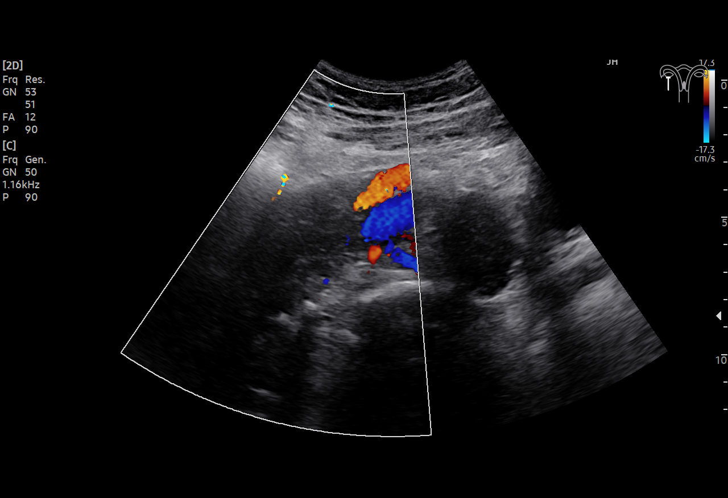
[im 49/94]
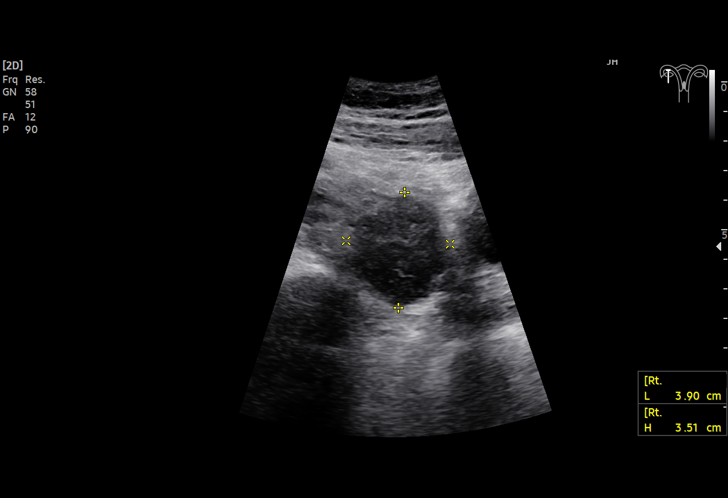
[im 52/94]
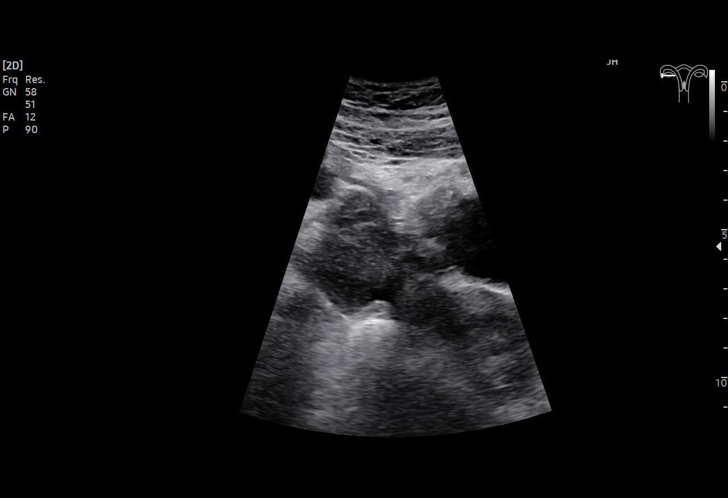
[im 59/94]
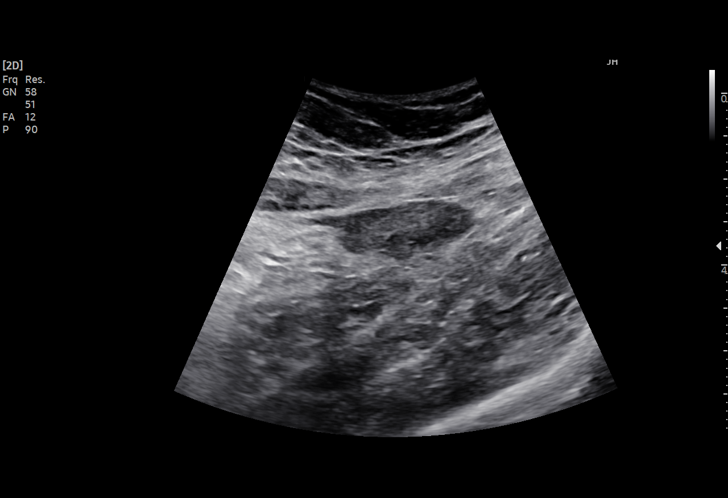
[im 66/94]
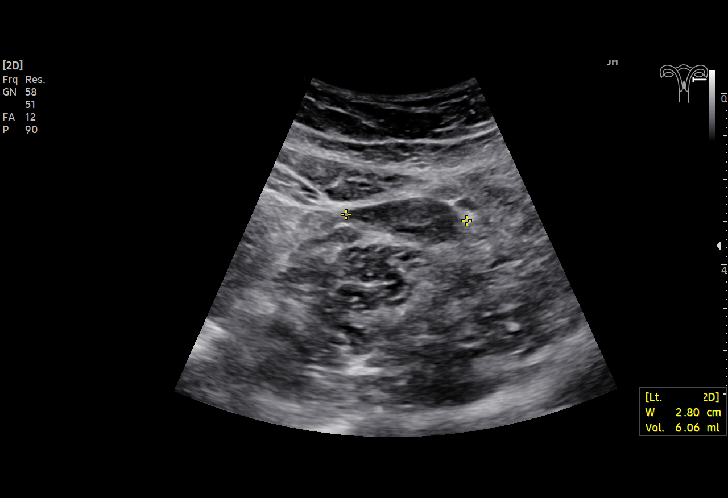
[im 73/94]
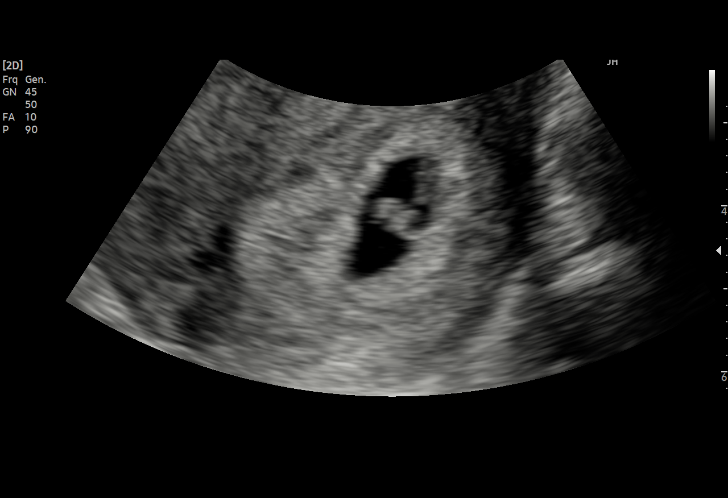
[im 80/94]
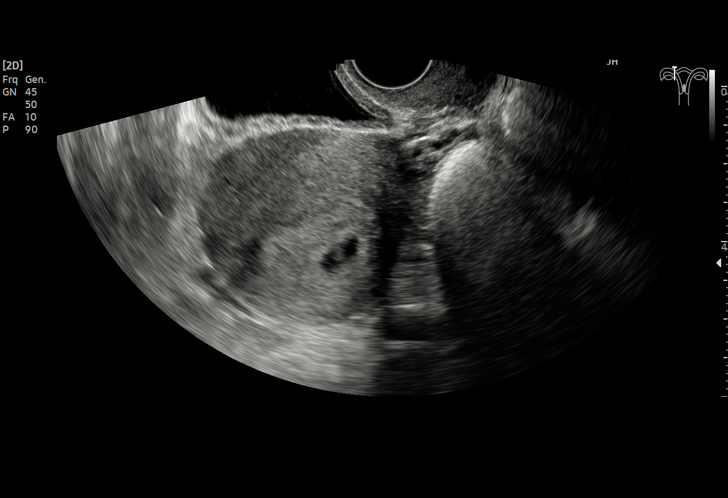
[im 87/94]
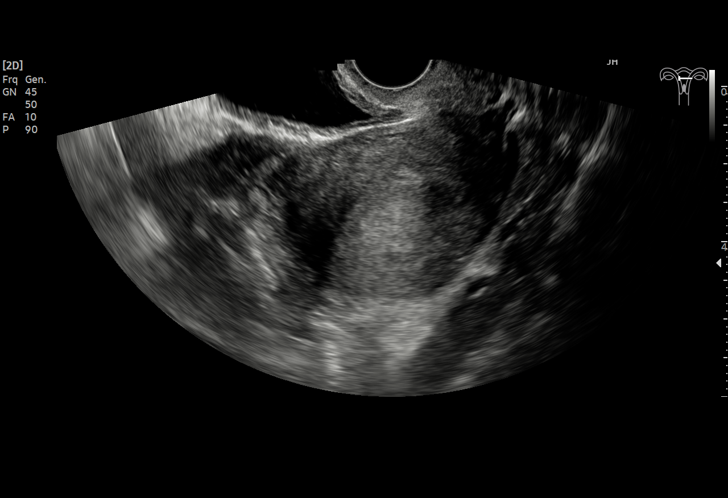
[im 94/94]
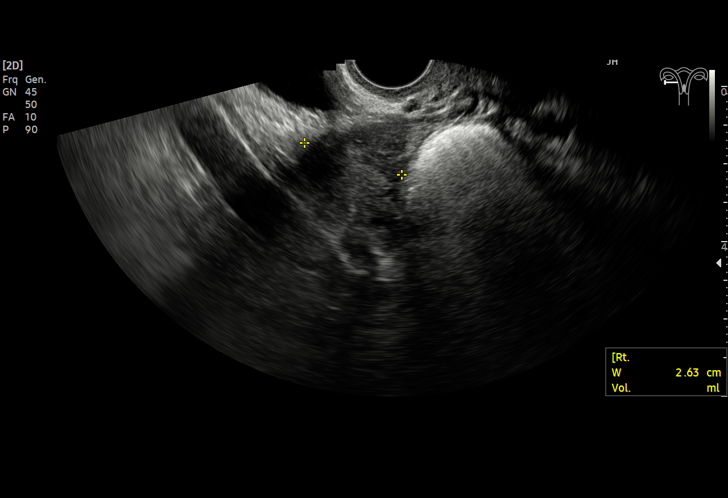

[15 of 28 positions shown; findings below may reference images not displayed]

FINDINGS: Intrauterine gestational sac: Single

Yolk sac:  Visualized.

Embryo:  Visualized.

Cardiac Activity: Visualized.

Heart Rate: 129 bpm

CRL:  5.2 mm   6 w   1 d                  US EDC: 04/11/2022

Subchorionic hemorrhage:  None visualized.

Maternal uterus/adnexae: Uterus and bilateral ovaries within normal
limits.
IMPRESSION: 1. Single live intrauterine gestation measuring 6 weeks 1 day by
crown-rump length.
2. Active heart tones at 129 BPM.

## 2023-03-29 ENCOUNTER — Encounter: Payer: Self-pay | Admitting: Student

## 2023-04-03 ENCOUNTER — Encounter: Payer: Self-pay | Admitting: Student

## 2023-04-03 ENCOUNTER — Other Ambulatory Visit: Payer: 59

## 2023-04-13 ENCOUNTER — Encounter: Payer: Self-pay | Admitting: Student

## 2023-04-19 ENCOUNTER — Ambulatory Visit
Admission: RE | Admit: 2023-04-19 | Discharge: 2023-04-19 | Disposition: A | Discharge status: Home / Self Care | Payer: 59 | Source: Ambulatory Visit | Attending: Student | Admitting: Student

## 2023-04-19 DIAGNOSIS — S39012A Strain of muscle, fascia and tendon of lower back, initial encounter: Secondary | ICD-10-CM

## 2023-04-19 DIAGNOSIS — R200 Anesthesia of skin: Secondary | ICD-10-CM

## 2023-05-02 ENCOUNTER — Ambulatory Visit
Admission: EM | Admit: 2023-05-02 | Discharge: 2023-05-02 | Disposition: A | Discharge status: Home / Self Care | Payer: 59 | Attending: Emergency Medicine | Admitting: Emergency Medicine

## 2023-05-02 DIAGNOSIS — J069 Acute upper respiratory infection, unspecified: Secondary | ICD-10-CM | POA: Diagnosis not present

## 2023-05-02 DIAGNOSIS — Z20828 Contact with and (suspected) exposure to other viral communicable diseases: Secondary | ICD-10-CM

## 2023-05-02 LAB — POC COVID19/FLU A&B COMBO
Covid Antigen, POC: NEGATIVE
Influenza A Antigen, POC: NEGATIVE
Influenza B Antigen, POC: NEGATIVE

## 2023-05-02 MED ORDER — ALBUTEROL SULFATE HFA 108 (90 BASE) MCG/ACT IN AERS: 2.0000 | INHALATION_SPRAY | Freq: Four times a day (QID) | RESPIRATORY_TRACT | 0 refills | Status: AC | PRN

## 2023-05-02 MED ORDER — PREDNISONE 10 MG (21) PO TBPK: ORAL_TABLET | Freq: Every day | ORAL | 0 refills | Status: AC

## 2023-05-02 MED ORDER — BENZONATATE 100 MG PO CAPS: 100.0000 mg | ORAL_CAPSULE | Freq: Three times a day (TID) | ORAL | 0 refills | Status: AC

## 2023-05-02 MED ORDER — PROMETHAZINE-DM 6.25-15 MG/5ML PO SYRP: 5.0000 mL | ORAL_SOLUTION | Freq: Every evening | ORAL | 0 refills | Status: AC | PRN

## 2023-05-02 NOTE — ED Triage Notes (Signed)
Patient to Urgent Care with complaints of SHOB with exertion/ nasal congestion/ cough/ runny nose/ headaches/ sore throat.  Symptoms started Friday. Son with RSV. Started feeling SHOB yesterday.   Meds: Dayquil/ Nyquil/ ibuprofen.

## 2023-05-02 NOTE — Discharge Instructions (Addendum)
Your symptoms today are most likely being caused by a virus and should steadily improve in time it can take up to 7 to 10 days before you truly start to see a turnaround however things will get better  COVID and flu test negative  Starting tomorrow take prednisone every morning with help with chest tightness and shortness of breath  You may use inhaler taking 2 puffs to help with shortness and wheezing, may find videos online if having difficulty using  May use Tessalon pill every 8 hours, may use cough syrup at bedtime    You can take Tylenol and/or Ibuprofen as needed for fever reduction and pain relief.   For cough: honey 1/2 to 1 teaspoon (you can dilute the honey in water or another fluid).  You can also use guaifenesin and dextromethorphan for cough. You can use a humidifier for chest congestion and cough.  If you don't have a humidifier, you can sit in the bathroom with the hot shower running.      For sore throat: try warm salt water gargles, cepacol lozenges, throat spray, warm tea or water with lemon/honey, popsicles or ice, or OTC cold relief medicine for throat discomfort.   For congestion: take a daily anti-histamine like Zyrtec, Claritin, and a oral decongestant, such as pseudoephedrine.  You can also use Flonase 1-2 sprays in each nostril daily.   It is important to stay hydrated: drink plenty of fluids (water, gatorade/powerade/pedialyte, juices, or teas) to keep your throat moisturized and help further relieve irritation/discomfort.

## 2023-05-02 NOTE — ED Provider Notes (Signed)
 Renaldo Fiddler    CSN: [id number] Arrival date & time: 05/02/23  1718      History   Chief Complaint Chief Complaint  Patient presents with   Nasal Congestion   Cough    HPI Felicia Duncan is a 23 y.o. female.   Patient presents for evaluation of fever that we continue, nasal congestion, rhinorrhea, sore throat, intermittent headaches, diarrhea present for 3 days.  Shortness of breath with exertion and centralized chest tightness beginning today.  Known exposure to RSV within household.  Has attempted use of DayQuil, NyQuil, Tylenol and boyfriends nebulizer, has been helpful but symptoms persist.  Poor appetite but able to tolerate some food and liquids.   Past Medical History:  Diagnosis Date   Anxiety     There are no active problems to display for this patient.   History reviewed. No pertinent surgical history.  OB History     Gravida  1   Para      Term      Preterm      AB      Living         SAB      IAB      Ectopic      Multiple      Live Births               Home Medications    Prior to Admission medications   Medication Sig Start Date End Date Taking? Authorizing Provider  albuterol (VENTOLIN HFA) 108 (90 Base) MCG/ACT inhaler Inhale 2 puffs into the lungs every 6 (six) hours as needed for wheezing or shortness of breath. 05/02/23  Yes Henok Heacock R, NP  benzonatate (TESSALON) 100 MG capsule Take 1 capsule (100 mg total) by mouth every 8 (eight) hours. 05/02/23  Yes Aubery Douthat R, NP  predniSONE (STERAPRED UNI-PAK 21 TAB) 10 MG (21) TBPK tablet Take by mouth daily. Take 6 tabs by mouth daily  for 1 days, then 5 tabs for 1 days, then 4 tabs for 1 days, then 3 tabs for 1 days, 2 tabs for 1 days, then 1 tab by mouth daily for 1 days 05/02/23  Yes Rashan Patient R, NP  promethazine-dextromethorphan (PROMETHAZINE-DM) 6.25-15 MG/5ML syrup Take 5 mLs by mouth at bedtime as needed for cough. 05/02/23  Yes Jalee Saine R, NP   busPIRone (BUSPAR) 7.5 MG tablet Take 7.5 mg by mouth 2 (two) times daily. 07/02/21   [provider]  cetirizine (ZYRTEC) 10 MG chewable tablet Chew 1 tablet (10 mg total) by mouth daily. 11/29/18   Wurst, [place], PA-C  escitalopram (LEXAPRO) 10 MG tablet Take 10 mg by mouth daily. 07/02/21   [provider]  FLUoxetine (PROZAC) 20 MG capsule Take 20 mg by mouth daily. 08/16/21   [provider]  fluticasone (FLONASE) 50 MCG/ACT nasal spray Place 2 sprays into both nostrils daily. 11/29/18   Wurst, [place], PA-C  meloxicam (MOBIC) 15 MG tablet Take 1 tablet (15 mg total) by mouth daily. 04/27/19   Lutricia Feil, PA-C  metaxalone (SKELAXIN) 800 MG tablet Take 1 tablet (800 mg total) by mouth 3 (three) times daily. 04/27/19   Lutricia Feil, PA-C  TRI-LO-MARZIA 0.18/0.215/0.25 MG-25 MCG tab Take 1 tablet by mouth daily. 10/17/17   [provider]    Family History Family History  Problem Relation Age of Onset   Hypertension Maternal Grandmother    Lupus Paternal Grandmother    Hypertension Mother  Healthy Father     Social History Social History   Tobacco Use   Smoking status: Never   Smokeless tobacco: Never  Vaping Use   Vaping status: Never Used  Substance Use Topics   Alcohol use: Never   Drug use: Never     Allergies   Patient has no known allergies.   Review of Systems Review of Systems   Physical Exam Triage Vital Signs ED Triage Vitals  Encounter Vitals Group     BP 05/02/23 1759 129/89     Systolic BP Percentile --      Diastolic BP Percentile --      Pulse Rate 05/02/23 1759 100     Resp 05/02/23 1759 19     Temp 05/02/23 1759 98.1 F (36.7 C)     Temp src --      SpO2 05/02/23 1759 98 %     Weight --      Height --      Head Circumference --      Peak Flow --      Pain Score 05/02/23 1753 8     Pain Loc --      Pain Education --      Exclude from Growth Chart --    No data found.  Updated Vital  Signs BP 129/89   Pulse 100   Temp 98.1 F (36.7 C)   Resp 19   LMP 03/28/2023   SpO2 98%   Visual Acuity Right Eye Distance:   Left Eye Distance:   Bilateral Distance:    Right Eye Near:   Left Eye Near:    Bilateral Near:     Physical Exam Constitutional:      Appearance: Normal appearance.  HENT:     Head: Normocephalic.     Right Ear: Tympanic membrane, ear canal and external ear normal.     Left Ear: Tympanic membrane, ear canal and external ear normal.     Nose: Congestion present. No rhinorrhea.     Mouth/Throat:     Pharynx: Posterior oropharyngeal erythema present. No oropharyngeal exudate.  Eyes:     Extraocular Movements: Extraocular movements intact.  Cardiovascular:     Rate and Rhythm: Normal rate and regular rhythm.     Pulses: Normal pulses.     Heart sounds: Normal heart sounds.  Pulmonary:     Effort: Pulmonary effort is normal.     Breath sounds: Normal breath sounds.  Musculoskeletal:     Cervical back: Normal range of motion and neck supple.  Neurological:     Mental Status: She is alert and oriented to person, place, and time. Mental status is at baseline.      UC Treatments / Results  Labs (all labs ordered are listed, but only abnormal results are displayed) Labs Reviewed  POC COVID19/FLU A&B COMBO - Normal    EKG   Radiology No results found.  Procedures Procedures (including critical care time)  Medications Ordered in UC Medications - No data to display  Initial Impression / Assessment and Plan / UC Course  I have reviewed the triage vital signs and the nursing notes.  Pertinent labs & imaging results that were available during my care of the patient were reviewed by me and considered in my medical decision making (see chart for details).  Viral URI with cough, exposure to RSV  Patient is in no signs of distress nor toxic appearing.  Vital signs are stable.  Low suspicion for pneumonia,  pneumothorax or bronchitis and  therefore will defer imaging.  COVID and flu test negative, etiology most likely RSV.  Lungs are clear to auscultation, stable for outpatient management.  Prescribed prednisone and albuterol inhaler for shortness of breath and wheezing, prescribed Tessalon and Promethazine DM for cough.May use additional over-the-counter medications as needed for supportive care.  May follow-up with urgent care as needed if symptoms persist or worsen.  Note given.   Final Clinical Impressions(s) / UC Diagnoses   Final diagnoses:  Viral URI with cough  Exposure to respiratory syncytial virus (RSV)     Discharge Instructions      Your symptoms today are most likely being caused by a virus and should steadily improve in time it can take up to 7 to 10 days before you truly start to see a turnaround however things will get better  COVID and flu test negative  Starting tomorrow take prednisone every morning with help with chest tightness and shortness of breath  You may use inhaler taking 2 puffs to help with shortness and wheezing, may find videos online if having difficulty using  May use Tessalon pill every 8 hours, may use cough syrup at bedtime    You can take Tylenol and/or Ibuprofen as needed for fever reduction and pain relief.   For cough: honey 1/2 to 1 teaspoon (you can dilute the honey in water or another fluid).  You can also use guaifenesin and dextromethorphan for cough. You can use a humidifier for chest congestion and cough.  If you don't have a humidifier, you can sit in the bathroom with the hot shower running.      For sore throat: try warm salt water gargles, cepacol lozenges, throat spray, warm tea or water with lemon/honey, popsicles or ice, or OTC cold relief medicine for throat discomfort.   For congestion: take a daily anti-histamine like Zyrtec, Claritin, and a oral decongestant, such as pseudoephedrine.  You can also use Flonase 1-2 sprays in each nostril daily.   It is important  to stay hydrated: drink plenty of fluids (water, gatorade/powerade/pedialyte, juices, or teas) to keep your throat moisturized and help further relieve irritation/discomfort.    ED Prescriptions     Medication Sig Dispense Auth. Provider   predniSONE (STERAPRED UNI-PAK 21 TAB) 10 MG (21) TBPK tablet Take by mouth daily. Take 6 tabs by mouth daily  for 1 days, then 5 tabs for 1 days, then 4 tabs for 1 days, then 3 tabs for 1 days, 2 tabs for 1 days, then 1 tab by mouth daily for 1 days 21 tablet Auriah Hollings R, NP   benzonatate (TESSALON) 100 MG capsule Take 1 capsule (100 mg total) by mouth every 8 (eight) hours. 21 capsule Deija Buhrman R, NP   promethazine-dextromethorphan (PROMETHAZINE-DM) 6.25-15 MG/5ML syrup Take 5 mLs by mouth at bedtime as needed for cough. 118 mL Zakyia Gagan R, NP   albuterol (VENTOLIN HFA) 108 (90 Base) MCG/ACT inhaler Inhale 2 puffs into the lungs every 6 (six) hours as needed for wheezing or shortness of breath. 18 g Valinda Hoar, NP      PDMP not reviewed this encounter.   Valinda Hoar, NP 05/02/23 [phone]
